# Patient Record
Sex: Female | Born: 2000 | Race: White | Hispanic: No | Marital: Single | Smoking: Never smoker
Health system: Southern US, Community
[De-identification: ages and names within clinical notes are randomized; demographics above are authoritative.]

---
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
## 2021-04-26 IMAGING — US US ART/VEN ABD/PELV/SCROTUM DOPPLER LTD
1 series · 14 of 25 positions shown · non-contrast
Comparison: None.

CLINICAL DATA: 18-year-old female with abdominal pain.

EXAM:
TRANSABDOMINAL ULTRASOUND OF PELVIS
DOPPLER ULTRASOUND OF OVARIES
TECHNIQUE: Transabdominal ultrasound examination of the pelvis was performed
including evaluation of the uterus, ovaries, adnexal regions, and
pelvic cul-de-sac.
Color and duplex Doppler ultrasound was utilized to evaluate blood
flow to the ovaries.

[Series 1: us art/ven abd/pelv/scrotum doppler ltd · 32 acquisitions, 14 frames shown]
[im 1/32]
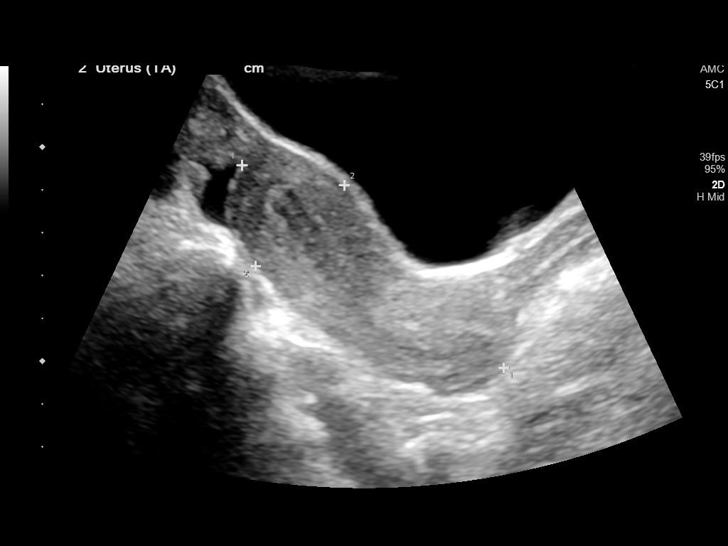
[im 3/32]
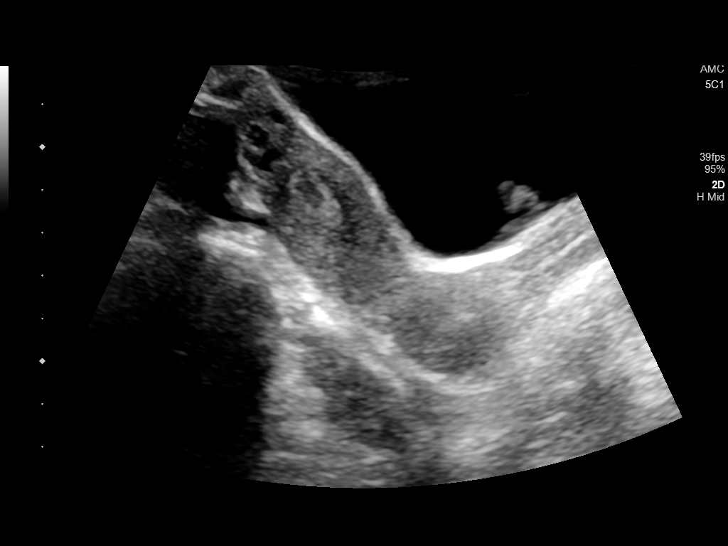
[im 6/32]
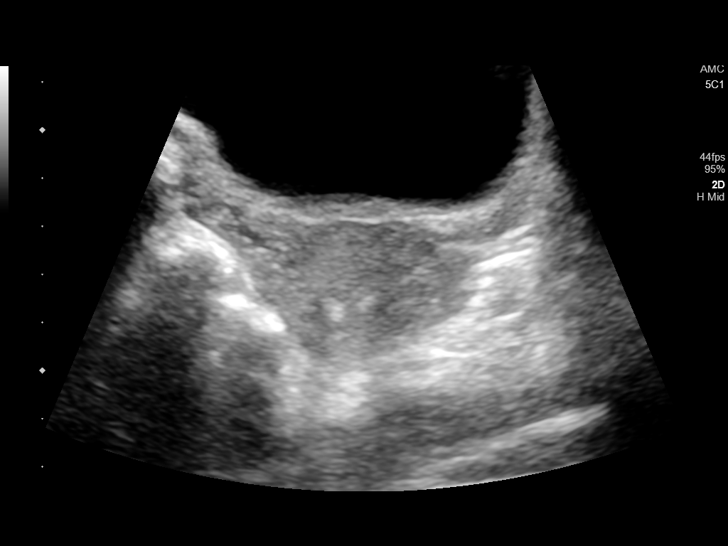
[im 8/32]
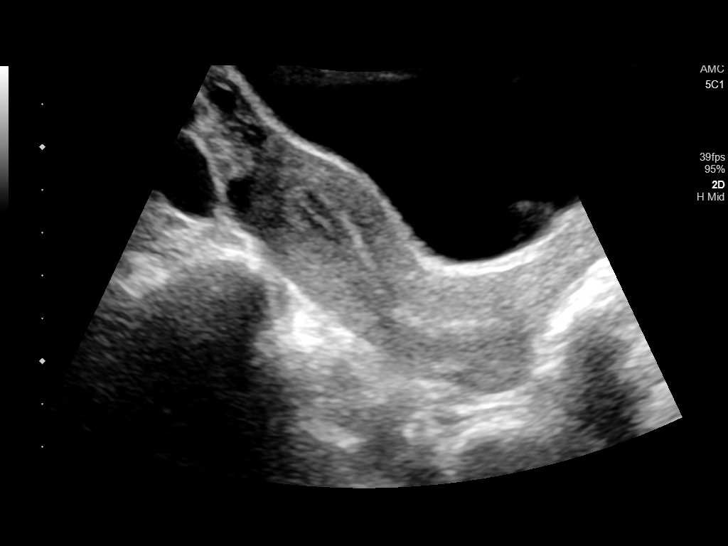
[im 11/32]
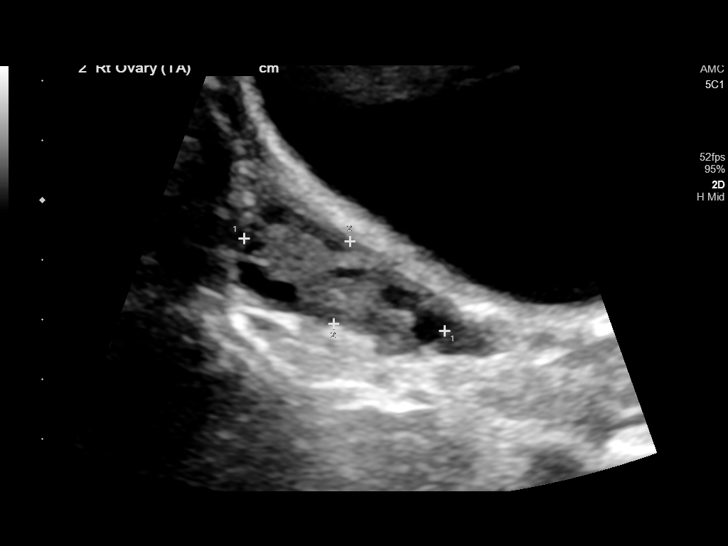
[im 12/32]
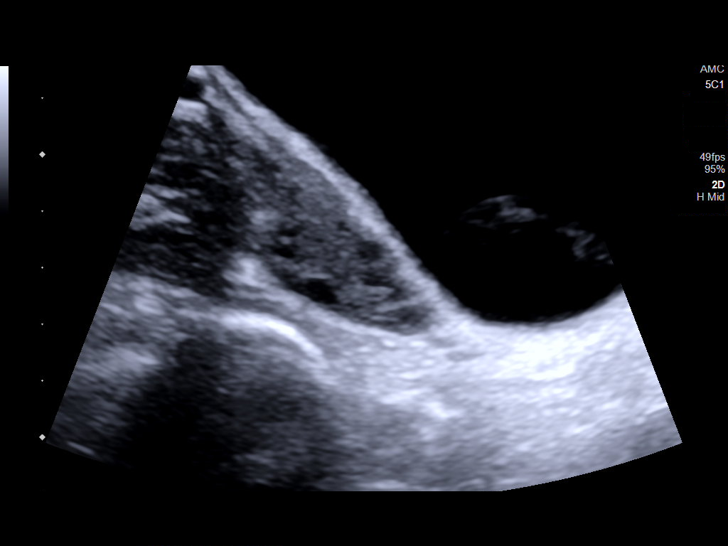
[im 15/32]
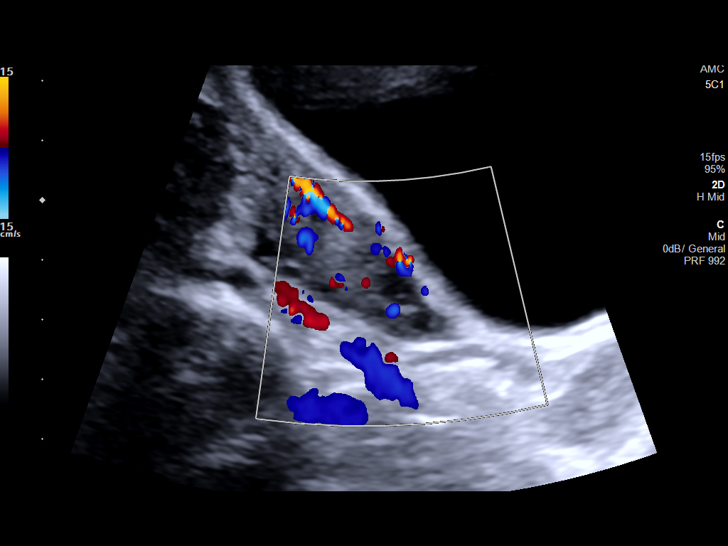
[im 17/32]
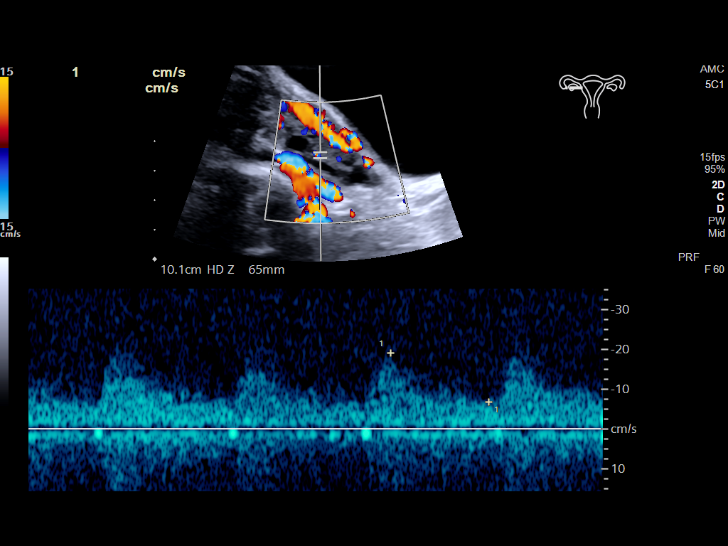
[im 20/32]
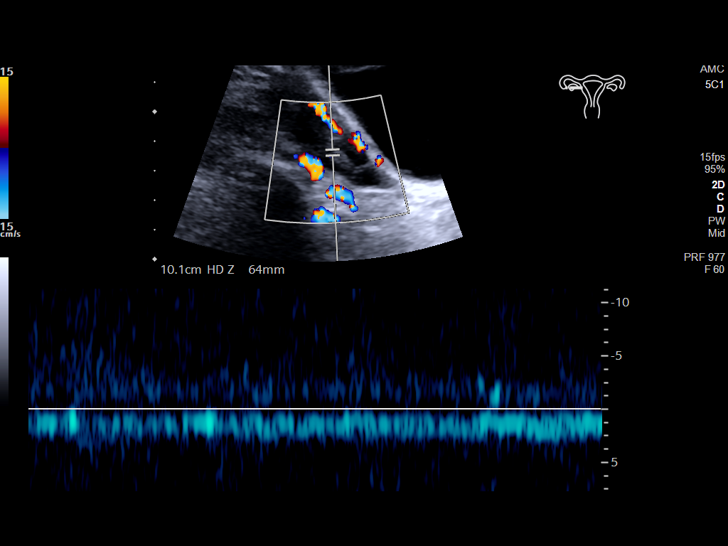
[im 21/32]
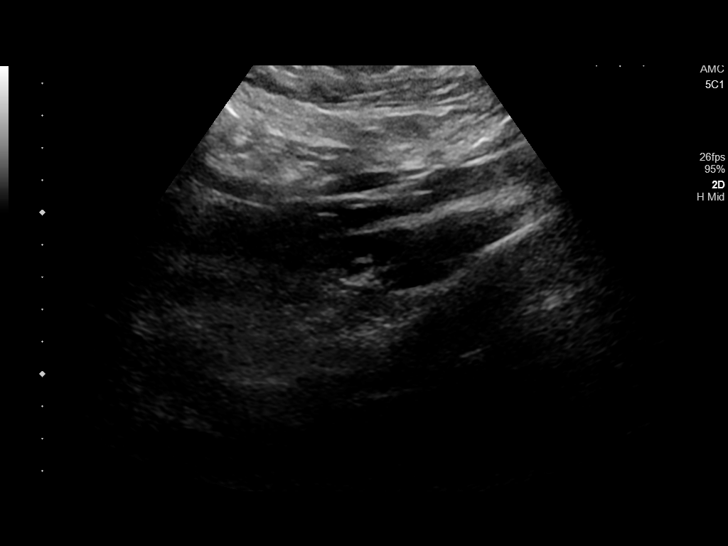
[im 24/32]
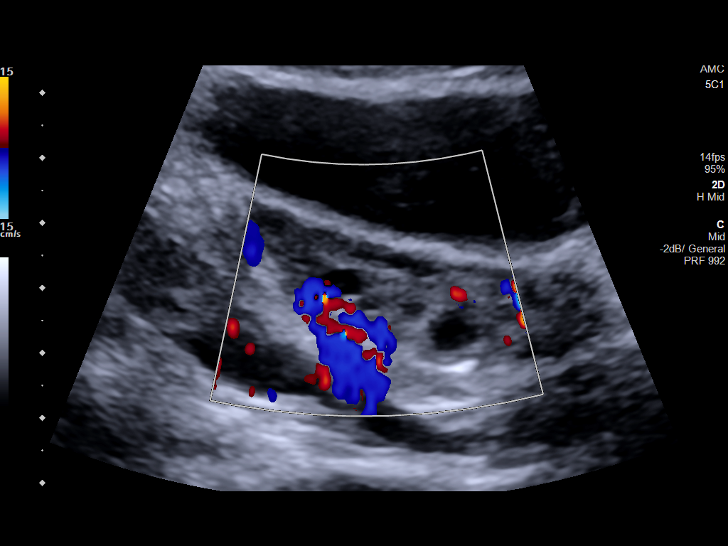
[im 26/32]
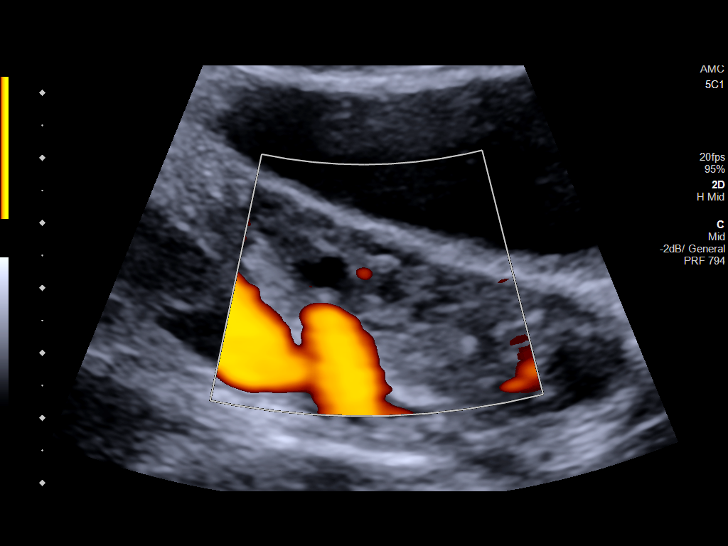
[im 29/32]
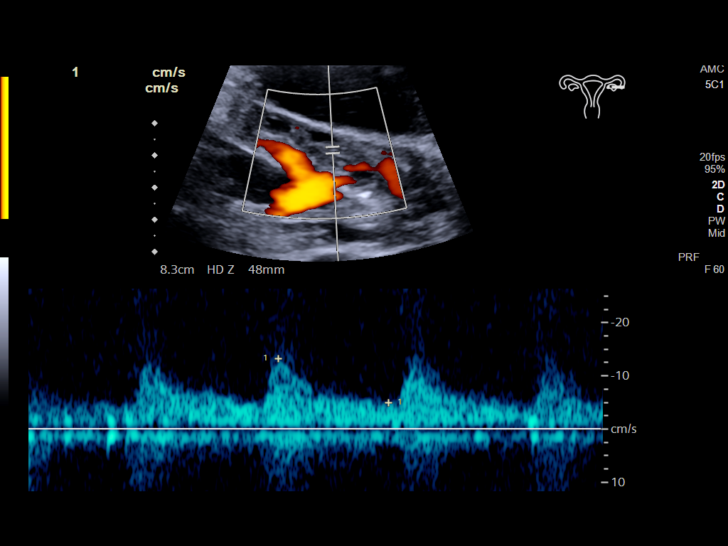
[im 32/32]
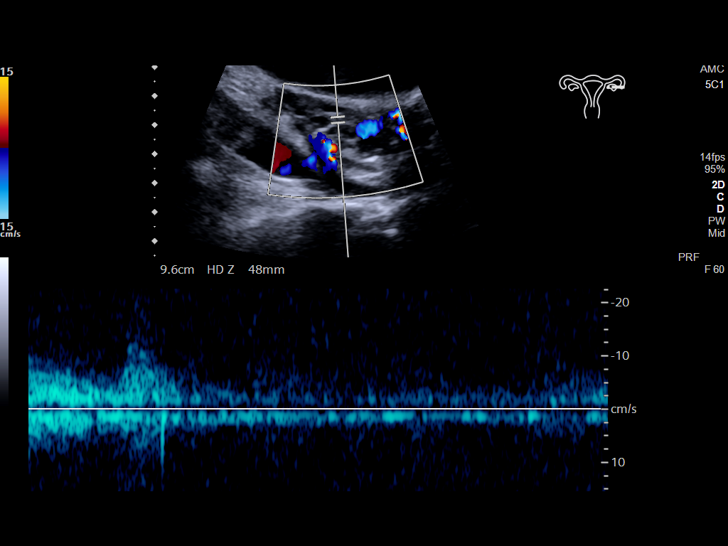

[14 of 25 positions shown; findings below may reference images not displayed]

FINDINGS: Uterus

Measurements: 7.7 x 2.8 x 4.7 cm = volume: 54 mL. The uterus is
anteverted and appears unremarkable.

Endometrium

Thickness: 10 mm.  No focal abnormality visualized.

Right ovary

Measurements: 3.7 x 1.4 x 2.6 cm = volume: 7.2 mL. Normal
appearance/no adnexal mass.

Left ovary

Measurements: 3.7 x 1.0 x 1.2 cm = volume: 3.4 mL. Normal
appearance/no adnexal mass.

Pulsed Doppler evaluation demonstrates normal low-resistance
arterial and venous waveforms in both ovaries.

Other: None
IMPRESSION: Unremarkable pelvic ultrasound.

## 2021-08-03 IMAGING — US US OB COMP LESS 14 WK
1 series · 14 of 28 positions shown · non-contrast
Comparison: Pelvic ultrasound 01/16/2019

CLINICAL DATA: BILATERAL lower abdominal cramping for 3 days, first
trimester pregnancy; LMP 03/19/2019

EXAM:
OBSTETRIC <14 WK ULTRASOUND
TECHNIQUE: Transabdominal ultrasound was performed for evaluation of the
gestation as well as the maternal uterus and adnexal regions.

[Series 1: us ob less than 14 weeks with ob transvaginal · 14 of 53 slices shown]
[im 2/53]
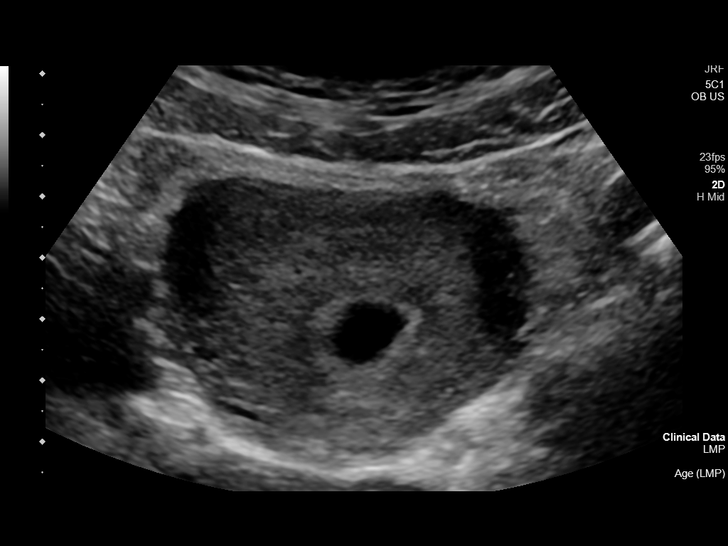
[im 6/53]
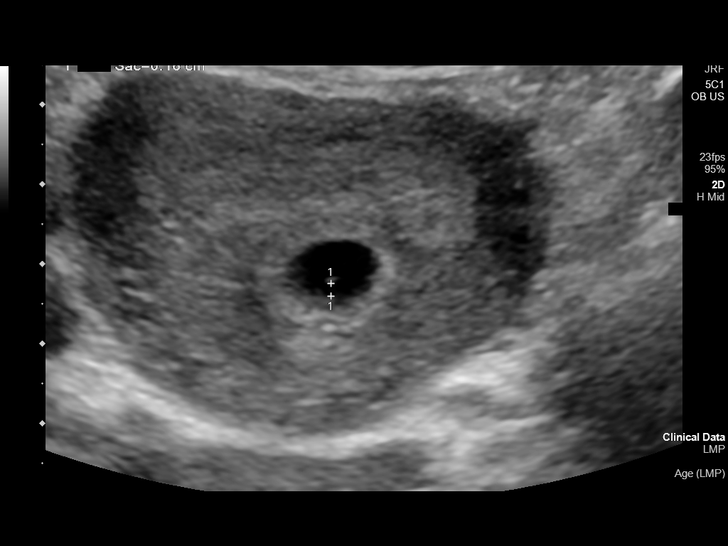
[im 10/53]
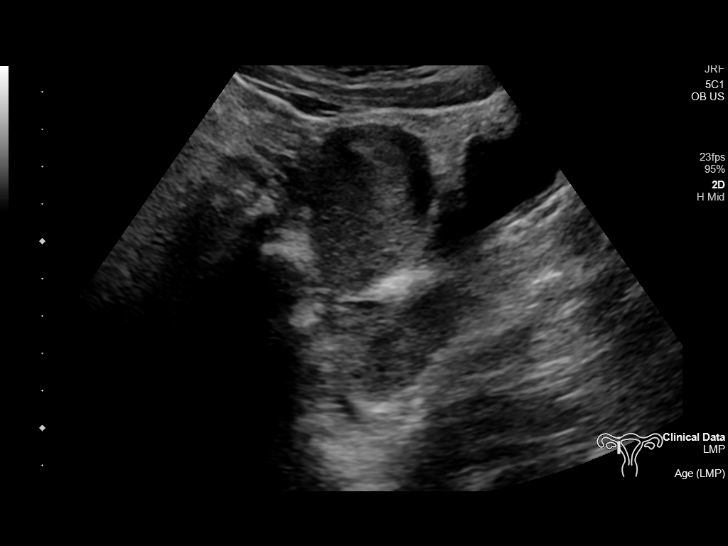
[im 14/53]
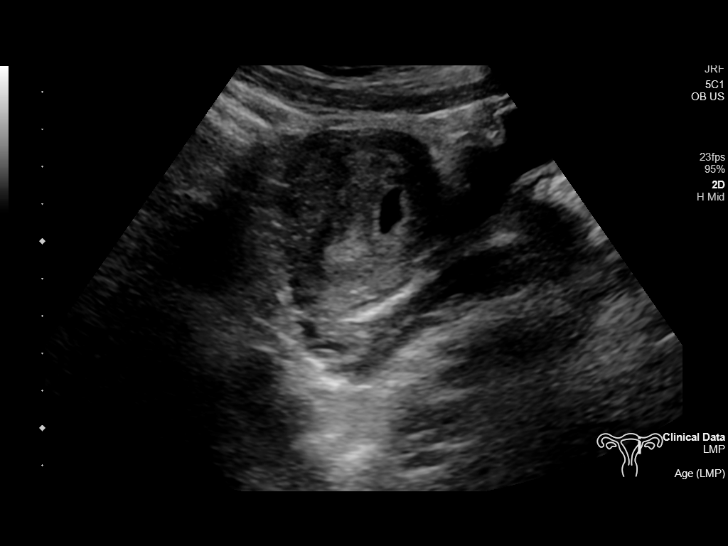
[im 18/53]
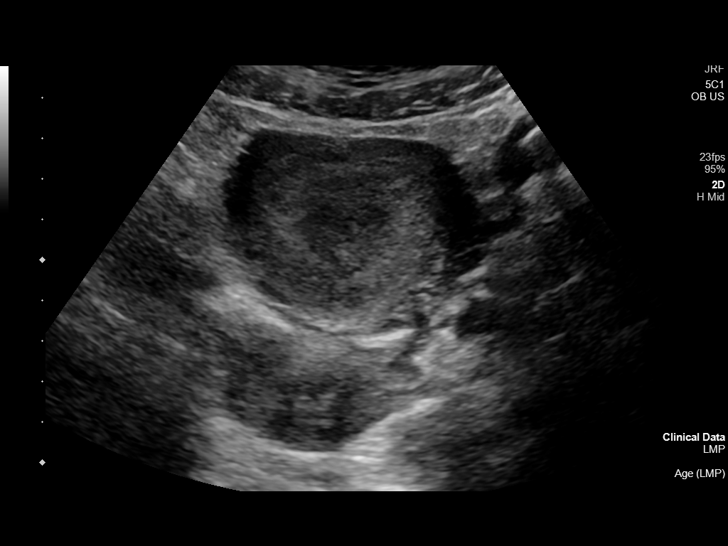
[im 22/53]
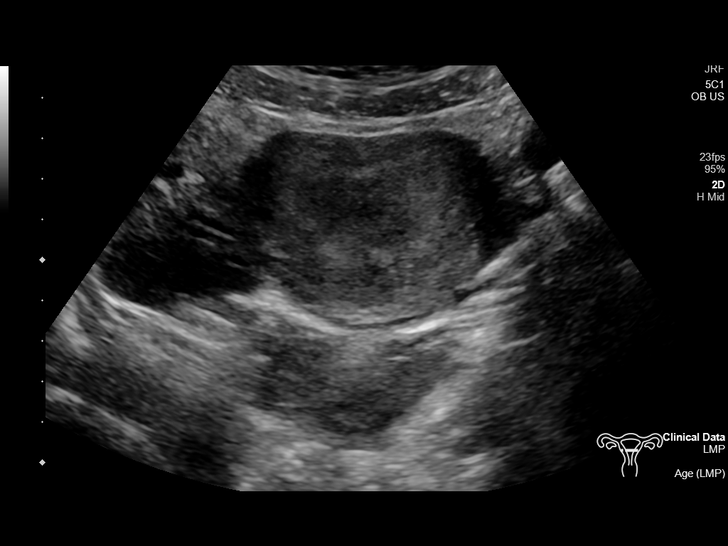
[im 26/53]
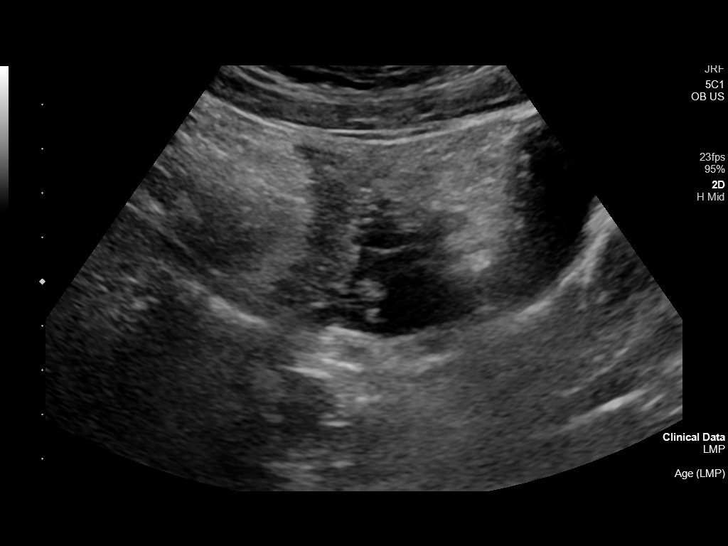
[im 29/53]
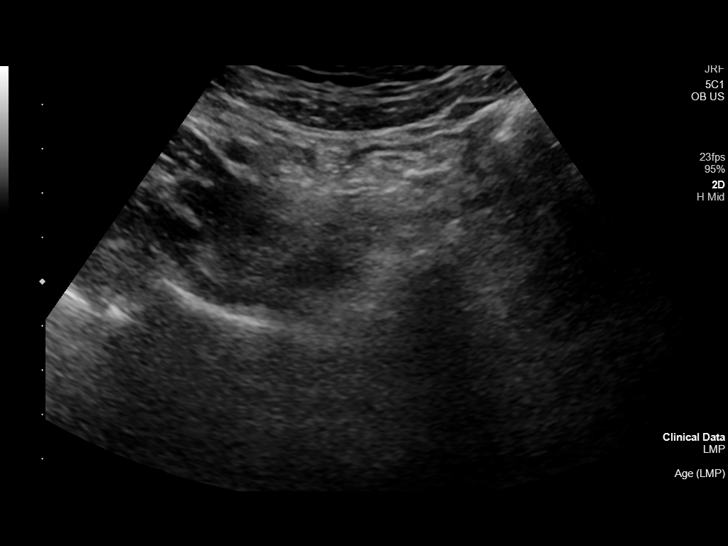
[im 33/53]
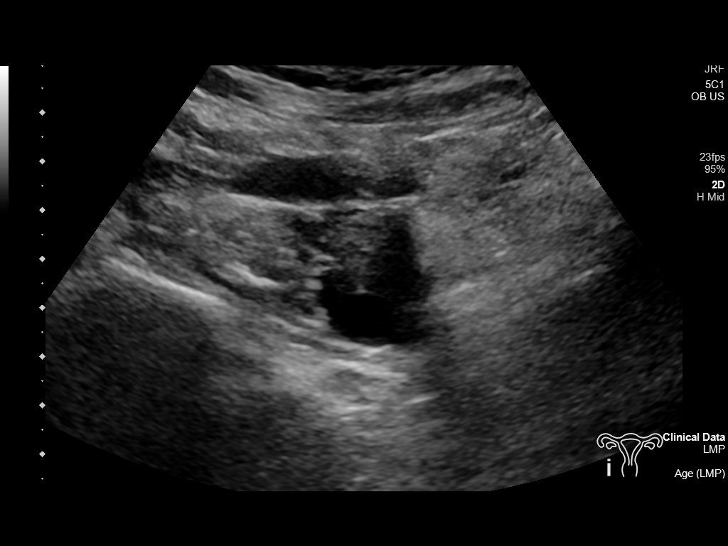
[im 37/53]
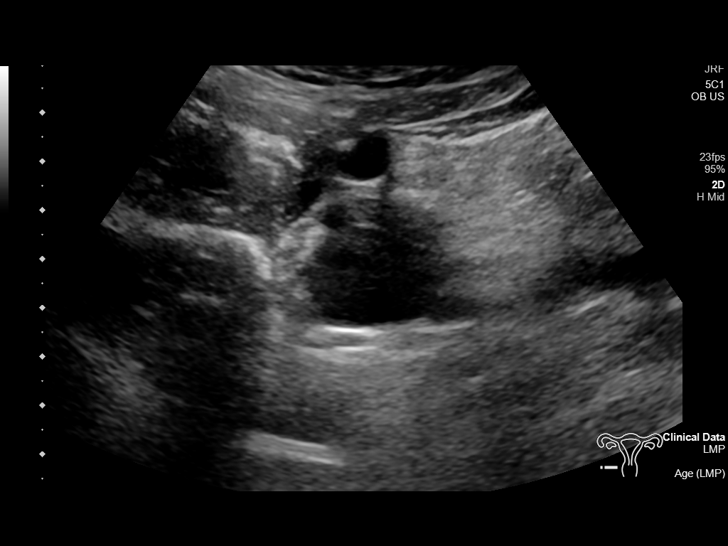
[im 41/53]
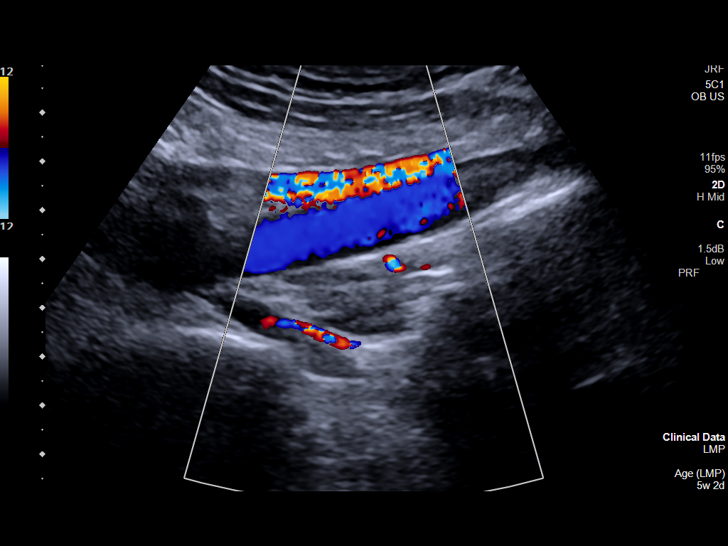
[im 45/53]
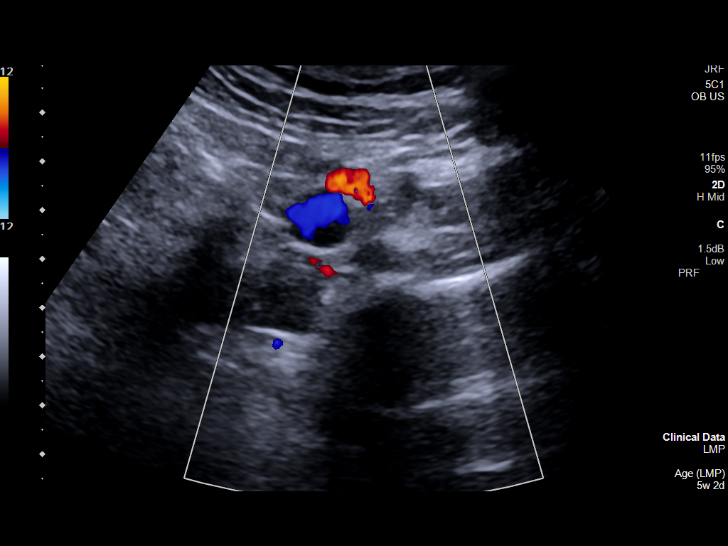
[im 49/53]
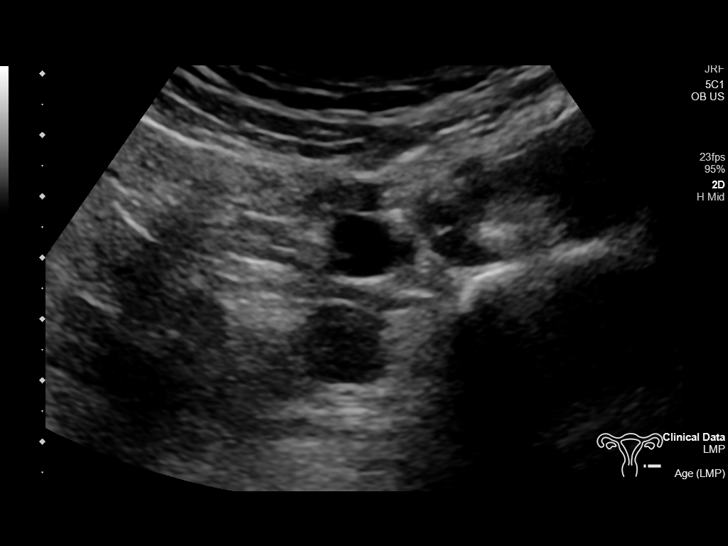
[im 53/53]
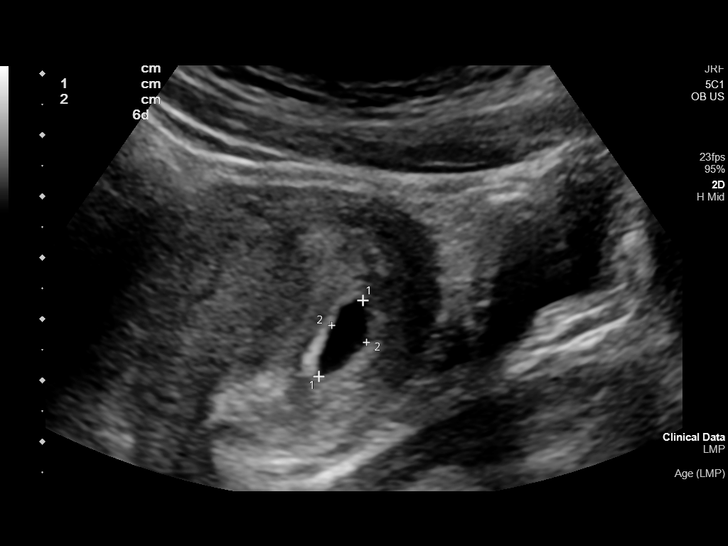

[14 of 28 positions shown; findings below may reference images not displayed]

FINDINGS: Intrauterine gestational sac: Present, single

Yolk sac:  Present

Embryo:  Not identified

Cardiac Activity: N/A

Heart Rate: N/A bpm

MSD:  11.0 mm   5 w   6 d

Subchorionic hemorrhage:  None visualized.

Maternal uterus/adnexae:

Maternal uterus otherwise unremarkable.

RIGHT ovary measures 3.0 x 2.1 x 2.7 cm and contains a small corpus
luteal cyst.

LEFT ovary normal size and morphology, 2.6 x 1.3 x 1.5 cm.

No free pelvic fluid or adnexal masses.
IMPRESSION: Small gestational sac identified within the uterus containing a yolk
sac.

No fetal pole identified to establish viability.

Consider follow-up obstetrical ultrasound in 14 days to establish
viability, if clinically indicated.
